# Patient Record
Sex: Female | Born: 1980 | Hispanic: Yes | Marital: Single | Smoking: Never smoker
Health system: Southern US, Community
[De-identification: ages and names within clinical notes are randomized; demographics above are authoritative.]

---

## 2014-05-25 ENCOUNTER — Encounter (HOSPITAL_COMMUNITY): Payer: Self-pay | Admitting: Emergency Medicine

## 2014-05-25 ENCOUNTER — Emergency Department (HOSPITAL_COMMUNITY)
Admission: EM | Admit: 2014-05-25 | Discharge: 2014-05-25 | Disposition: A | Discharge status: Home / Self Care | Payer: No Typology Code available for payment source | Attending: Emergency Medicine | Admitting: Emergency Medicine

## 2014-05-25 ENCOUNTER — Emergency Department (HOSPITAL_COMMUNITY): Payer: No Typology Code available for payment source

## 2014-05-25 DIAGNOSIS — Y92410 Unspecified street and highway as the place of occurrence of the external cause: Secondary | ICD-10-CM | POA: Insufficient documentation

## 2014-05-25 DIAGNOSIS — IMO0002 Reserved for concepts with insufficient information to code with codable children: Secondary | ICD-10-CM | POA: Insufficient documentation

## 2014-05-25 DIAGNOSIS — Y9389 Activity, other specified: Secondary | ICD-10-CM | POA: Insufficient documentation

## 2014-05-25 DIAGNOSIS — T07XXXA Unspecified multiple injuries, initial encounter: Secondary | ICD-10-CM

## 2014-05-25 DIAGNOSIS — S6990XA Unspecified injury of unspecified wrist, hand and finger(s), initial encounter: Secondary | ICD-10-CM | POA: Insufficient documentation

## 2014-05-25 DIAGNOSIS — Z79899 Other long term (current) drug therapy: Secondary | ICD-10-CM | POA: Insufficient documentation

## 2014-05-25 MED ORDER — NAPROXEN 500 MG PO TABS: 500.0000 mg | ORAL_TABLET | Freq: Two times a day (BID) | ORAL | Status: AC

## 2014-05-25 MED ORDER — HYDROCODONE-ACETAMINOPHEN 5-325 MG PO TABS: 1.0000 | ORAL_TABLET | ORAL | Status: AC | PRN

## 2014-05-25 NOTE — ED Notes (Signed)
Pt comfortable with discharge and follow up instructions. Pt declines wheelchair and was shown to a waiting area. Prescriptions x2.

## 2014-05-25 NOTE — Discharge Instructions (Signed)
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
